# Patient Record
Sex: Male | Born: 1960 | Race: Black or African American | Hispanic: No | Marital: Single | Smoking: Current every day smoker
Health system: Southern US, Community
[De-identification: ages and names within clinical notes are randomized; demographics above are authoritative.]

## PROBLEM LIST (undated history)

## (undated) DIAGNOSIS — Z720 Tobacco use: Secondary | ICD-10-CM

## (undated) HISTORY — DX: Tobacco use: Z72.0

## (undated) HISTORY — PX: HERNIA REPAIR: SHX51

## (undated) HISTORY — PX: COLONOSCOPY: SHX174

---

## 1998-06-25 ENCOUNTER — Ambulatory Visit (HOSPITAL_BASED_OUTPATIENT_CLINIC_OR_DEPARTMENT_OTHER): Admission: RE | Admit: 1998-06-25 | Discharge: 1998-06-25 | Payer: Self-pay | Admitting: Otolaryngology

## 2000-05-28 ENCOUNTER — Emergency Department (HOSPITAL_COMMUNITY): Admission: EM | Admit: 2000-05-28 | Discharge: 2000-05-28 | Payer: Self-pay | Admitting: *Deleted

## 2000-05-28 ENCOUNTER — Encounter: Payer: Self-pay | Admitting: Emergency Medicine

## 2000-11-01 ENCOUNTER — Ambulatory Visit (HOSPITAL_BASED_OUTPATIENT_CLINIC_OR_DEPARTMENT_OTHER): Admission: RE | Admit: 2000-11-01 | Discharge: 2000-11-01 | Payer: Self-pay | Admitting: Surgery

## 2004-07-07 ENCOUNTER — Emergency Department (HOSPITAL_COMMUNITY): Admission: EM | Admit: 2004-07-07 | Discharge: 2004-07-07 | Payer: Self-pay | Admitting: Family Medicine

## 2004-10-19 ENCOUNTER — Emergency Department (HOSPITAL_COMMUNITY): Admission: EM | Admit: 2004-10-19 | Discharge: 2004-10-19 | Payer: Self-pay | Admitting: Emergency Medicine

## 2005-01-12 ENCOUNTER — Emergency Department (HOSPITAL_COMMUNITY): Admission: EM | Admit: 2005-01-12 | Discharge: 2005-01-12 | Payer: Self-pay | Admitting: Family Medicine

## 2005-08-02 ENCOUNTER — Emergency Department (HOSPITAL_COMMUNITY): Admission: EM | Admit: 2005-08-02 | Discharge: 2005-08-02 | Payer: Self-pay | Admitting: Family Medicine

## 2005-09-11 ENCOUNTER — Emergency Department (HOSPITAL_COMMUNITY): Admission: EM | Admit: 2005-09-11 | Discharge: 2005-09-11 | Payer: Self-pay | Admitting: Emergency Medicine

## 2007-03-22 ENCOUNTER — Emergency Department (HOSPITAL_COMMUNITY): Admission: EM | Admit: 2007-03-22 | Discharge: 2007-03-22 | Payer: Self-pay | Admitting: Family Medicine

## 2007-03-28 ENCOUNTER — Encounter: Admission: RE | Admit: 2007-03-28 | Discharge: 2007-03-28 | Payer: Self-pay | Admitting: Emergency Medicine

## 2008-06-10 ENCOUNTER — Emergency Department (HOSPITAL_COMMUNITY): Admission: AD | Admit: 2008-06-10 | Discharge: 2008-06-10 | Payer: Self-pay | Admitting: Family Medicine

## 2008-08-02 ENCOUNTER — Emergency Department (HOSPITAL_COMMUNITY): Admission: EM | Admit: 2008-08-02 | Discharge: 2008-08-03 | Payer: Self-pay | Admitting: Emergency Medicine

## 2008-12-10 ENCOUNTER — Encounter: Admission: RE | Admit: 2008-12-10 | Discharge: 2008-12-10 | Payer: Self-pay | Admitting: Family Medicine

## 2010-01-24 ENCOUNTER — Emergency Department (HOSPITAL_COMMUNITY): Admission: EM | Admit: 2010-01-24 | Discharge: 2010-01-24 | Payer: Self-pay | Admitting: Emergency Medicine

## 2010-07-16 LAB — POCT CARDIAC MARKERS
CKMB, poc: 1.1 ng/mL (ref 1.0–8.0)
Myoglobin, poc: 40.4 ng/mL (ref 12–200)
Myoglobin, poc: 66.9 ng/mL (ref 12–200)
Troponin i, poc: <0.05 ng/mL (ref 0.00–0.09)

## 2010-07-16 LAB — CBC
Hemoglobin: 15.7 g/dL (ref 13.0–17.0)
MCH: 31.2 pg (ref 26.0–34.0)
MCHC: 34.6 g/dL (ref 30.0–36.0)
MCV: 90.3 fL (ref 78.0–100.0)
RBC: 5.03 MIL/uL (ref 4.22–5.81)

## 2010-07-16 LAB — POCT I-STAT, CHEM 8
BUN: 16 mg/dL (ref 6–23)
Chloride: 105 mEq/L (ref 96–112)
Creatinine, Ser: 1.3 mg/dL (ref 0.4–1.5)
Hemoglobin: 17.3 g/dL — ABNORMAL HIGH (ref 13.0–17.0)
Potassium: 4.3 mEq/L (ref 3.5–5.1)
Sodium: 139 mEq/L (ref 135–145)

## 2010-07-16 LAB — DIFFERENTIAL
Basophils Relative: 1 % (ref 0–1)
Eosinophils Absolute: 0.3 10*3/uL (ref 0.0–0.7)
Eosinophils Relative: 5 % (ref 0–5)
Lymphs Abs: 2 10*3/uL (ref 0.7–4.0)
Monocytes Absolute: 0.4 10*3/uL (ref 0.1–1.0)
Monocytes Relative: 7 % (ref 3–12)

## 2010-08-18 LAB — CBC
Hemoglobin: 14.5 g/dL (ref 13.0–17.0)
MCHC: 33.9 g/dL (ref 30.0–36.0)
RDW: 14.1 % (ref 11.5–15.5)

## 2010-08-18 LAB — DIFFERENTIAL
Basophils Absolute: 0 10*3/uL (ref 0.0–0.1)
Basophils Relative: 1 % (ref 0–1)
Eosinophils Relative: 3 % (ref 0–5)
Monocytes Absolute: 0.3 10*3/uL (ref 0.1–1.0)
Neutro Abs: 3.3 10*3/uL (ref 1.7–7.7)

## 2010-08-18 LAB — BASIC METABOLIC PANEL
BUN: 10 mg/dL (ref 6–23)
CO2: 29 mEq/L (ref 19–32)
Calcium: 9.5 mg/dL (ref 8.4–10.5)
Glucose, Bld: 83 mg/dL (ref 70–99)
Sodium: 138 mEq/L (ref 135–145)

## 2010-08-18 LAB — POCT CARDIAC MARKERS
CKMB, poc: <1 ng/mL — ABNORMAL LOW (ref 1.0–8.0)
Myoglobin, poc: 41.8 ng/mL (ref 12–200)

## 2010-09-18 NOTE — Op Note (Signed)
[place]  Patient:    JEMELL, TOWN                       MRN: [id number] Proc. Date: 11/01/00 Adm. Date:  [id number] Attending:  Katha Cabal CC:         Jethro Bastos, M.D.   Operative Report  CCS# [id number]  PREOPERATIVE DIAGNOSIS:  Bilateral inguinal hernias.  POSTOPERATIVE DIAGNOSIS:  Bilateral direct hernias, right greater than left.  OPERATION PERFORMED:  Laparoscopic bilateral herniorrhaphy.  SURGEON:  Thornton Park. Daphine Deutscher, M.D.  ANESTHESIA:  General endotracheal.  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  Informed consent was obtained regarding laparoscopic as well as open repair with complications not limited to recurrence, numbness, infection and bleeding were explained to him both in the office and preoperatively.  He was taken to room 1 at [facility] and after general anesthesia was administered.  The abdomen was prepped with Betadine and draped sterilely and a Foley catheter was inserted.  He had also received a gram of Ancef.  A longitudinal incision was made just below the umbilicus and went off to the right of midline and incised the anterior rectus sheath.  A pursestring suture was placed around this.  I dissected the rectus muscle laterally and inserted a balloon down to the pubis.  With the scope in place,  This was insufflated to create a nice preperitoneal dissection.  The balloon was then withdrawn and a port with a balloon for anchoring was inserted and the preperitoneal space was inflated.  Using the Marcaine needle, I went ahead and injected the subsequent port sites and used this to guide into the preperitoneal space.  Two 5 mm ports were placed. Dissection was started on the right side and the fairly large right direct hernia was easily visualized.   There was a nice clear demarcation of the peritoneum well down on the cord structures indicating that there was no evidence of an indirect hernia.  Nevertheless I did skeletonize the cord, identified the cord structures and separated these on the right side. Attention was directed to the left side where again, a left direct hernia was visualized after removing some of the fatty tissue that was incarcerated up within it.  These cord structures, too, were dissected free and then elected to go ahead and begin the repair first on the right side.  A 4 x 6 piece of mesh was cut and brought into the preperitoneal space and tacked to the pubis medially and anteriorly along the linea.  It was then rolled out laterally where it was tacked anteriorly and wherever I could feel it laterally but not going below the inguinal ligament.  This was tacked medially along Coopers ligament down to the femoral vein.  This provided good coverage of the direct defect.  A similar size piece of mesh was cut and placed on the left side, then overlapping the one on the right, again the hernia tacker was used to place these titanium screws ([employer]) along Coopers ligament and then anteriorly and laterally again where I could always feel it.  The preperitoneal space was then inspected and no bleeding was seen.  The area was deflated and the 5 mm ports were withdrawn and the umbilical port was tied down.  The skin was  closed with 4-0 Vicryl with benzoin and Steri-Strips.  The patient seemed to tolerate the procedure well.  If he tolerates this well, he will be discharged from the recovery room or if not, he may be kept overnight in the [facility]. DD:  11/01/00 TD:  11/01/00 Job: [id number]
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
## 2012-12-27 ENCOUNTER — Encounter (HOSPITAL_COMMUNITY): Payer: Self-pay | Admitting: Emergency Medicine

## 2012-12-27 ENCOUNTER — Emergency Department (HOSPITAL_COMMUNITY)
Admission: EM | Admit: 2012-12-27 | Discharge: 2012-12-27 | Disposition: A | Discharge status: Home / Self Care | Payer: BC Managed Care – PPO | Source: Home / Self Care | Attending: Emergency Medicine | Admitting: Emergency Medicine

## 2012-12-27 DIAGNOSIS — S39012A Strain of muscle, fascia and tendon of lower back, initial encounter: Secondary | ICD-10-CM

## 2012-12-27 DIAGNOSIS — S335XXA Sprain of ligaments of lumbar spine, initial encounter: Secondary | ICD-10-CM

## 2012-12-27 MED ORDER — CYCLOBENZAPRINE HCL 5 MG PO TABS: 5.0000 mg | ORAL_TABLET | Freq: Three times a day (TID) | ORAL | Status: DC | PRN

## 2012-12-27 MED ORDER — HYDROCODONE-ACETAMINOPHEN 5-325 MG PO TABS: 1.0000 | ORAL_TABLET | Freq: Three times a day (TID) | ORAL | Status: DC | PRN

## 2012-12-27 MED ORDER — DICLOFENAC SODIUM 50 MG PO TBEC: 50.0000 mg | DELAYED_RELEASE_TABLET | Freq: Two times a day (BID) | ORAL | Status: DC

## 2012-12-27 NOTE — ED Provider Notes (Signed)
Medical screening examination/treatment/procedure(s) were performed by resident-physician practitioner and as supervising physician I was immediately available for consultation/collaboration.  Davonte Siebenaler   Miller Edgington, MD 12/27/12 2012 

## 2012-12-27 NOTE — ED Notes (Addendum)
Pt c/o lower back pain x 5 days. Pt states he thinks he hurt his back from picking up heavy luggage and carrying it up stairs. Pt has not taken any meds.... Jan Ranson, SMA

## 2012-12-27 NOTE — ED Provider Notes (Signed)
CSN: [id number]     Arrival date & time 12/27/12  1820 History   First MD Initiated Contact with Patient 12/27/12 1923     Chief Complaint  Patient presents with  . Back Pain   (Consider location/radiation/quality/duration/timing/severity/associated sxs/prior Treatment) HPI Pt is 52 yo M with lower back pain. Pain started 5 days ago, unchanged since then. He states he was carrying heavy luggage up the stairs the night before. No pops, pain or ache at time of carrying luggage. Hurts most with changing positions or lying flat. Tried taking a muscle relaxer which did not help. Nothing makes the pain better. Never had back problems before. Current pain is 8/10. No numbness or tingling in legs. No loss of bowel or bladder. He works as a Education administrator.   History reviewed. No pertinent past medical history. History reviewed. No pertinent past surgical history. No family history on file. History  Substance Use Topics  . Smoking status: Never Smoker   . Smokeless tobacco: Not on file  . Alcohol Use: Not on file   Review of Systems  Constitutional: Negative for fever and chills.  HENT: Negative for congestion.   Eyes: Negative for visual disturbance.  Respiratory: Negative for cough and shortness of breath.   Cardiovascular: Negative for chest pain and leg swelling.  Gastrointestinal: Negative for abdominal pain.  Genitourinary: Negative for dysuria.  Musculoskeletal: Positive for back pain. Negative for myalgias, arthralgias and gait problem.  Skin: Negative for rash.  Neurological: Negative for headaches.    Allergies  Review of patient's allergies indicates no known allergies.  Home Medications   Current Outpatient Rx  Name  Route  Sig  Dispense  Refill  . cyclobenzaprine (FLEXERIL) 5 MG tablet   Oral   Take 1 tablet (5 mg total) by mouth 3 (three) times daily as needed for muscle spasms.   20 tablet   0   . diclofenac (VOLTAREN) 50 MG EC tablet   Oral   Take 1 tablet (50 mg total)  by mouth 2 (two) times daily.   20 tablet   0   . HYDROcodone-acetaminophen (NORCO/VICODIN) 5-325 MG per tablet   Oral   Take 1 tablet by mouth every 8 (eight) hours as needed for pain.   20 tablet   0    BP 134/88  Temp(Src) 98.2 F (36.8 C) (Oral)  Resp 14  SpO2 95% Physical Exam  Constitutional: He is oriented to person, place, and time. He appears well-developed and well-nourished. No distress.  HENT:  Head: Normocephalic and atraumatic.  Mouth/Throat: Oropharynx is clear and moist.  Eyes: Pupils are equal, round, and reactive to light.  Neck: Normal range of motion. Neck supple.  Cardiovascular: Normal rate, regular rhythm and normal heart sounds.   Pulmonary/Chest: Effort normal and breath sounds normal. He has no wheezes.  Abdominal: Soft. There is no tenderness.  Musculoskeletal: Normal range of motion. He exhibits no edema.       Lumbar back: He exhibits tenderness (Paraspinal muscle tenderness R>L). He exhibits normal range of motion, no bony tenderness, no deformity and no spasm.  Negative straight leg raise  Lymphadenopathy:    He has no cervical adenopathy.  Neurological: He is alert and oriented to person, place, and time. He displays normal reflexes. No cranial nerve deficit. Coordination normal.  Skin: Skin is warm and dry. No rash noted.    ED Course  Procedures (including critical care time) Labs Review Labs Reviewed - No data to display Imaging Review No  results found.  MDM   1. Low back strain, initial encounter    52 yo previously healthy male with low back strain after carrying heavy luggage - No acute indication for imaging - Flexeril prn spasm - Diclofenac for anti-inflammatory - Vicodin prn for severe pain - Encouraged to rest and use ice on back - Out of work for next 2 days with no heavy lifting for 5 days. - Establish new PCP but f/u here at [facility] if pain worsens or fails to improve  Hilarie Fredrickson, MD 12/27/12 1953

## 2013-08-08 ENCOUNTER — Ambulatory Visit
Admission: RE | Admit: 2013-08-08 | Discharge: 2013-08-08 | Disposition: A | Discharge status: Home / Self Care | Payer: BC Managed Care – PPO | Source: Ambulatory Visit | Attending: Medical | Admitting: Medical

## 2013-08-08 ENCOUNTER — Encounter: Payer: Self-pay | Admitting: Medical

## 2013-08-08 ENCOUNTER — Ambulatory Visit (INDEPENDENT_AMBULATORY_CARE_PROVIDER_SITE_OTHER): Payer: BC Managed Care – PPO | Admitting: Medical

## 2013-08-08 VITALS — BP 130/80 | HR 82 | Temp 98.2°F | Resp 14 | Ht 66.2 in | Wt 137.0 lb

## 2013-08-08 DIAGNOSIS — R5381 Other malaise: Secondary | ICD-10-CM

## 2013-08-08 DIAGNOSIS — R630 Anorexia: Secondary | ICD-10-CM

## 2013-08-08 DIAGNOSIS — F17200 Nicotine dependence, unspecified, uncomplicated: Secondary | ICD-10-CM

## 2013-08-08 DIAGNOSIS — R5383 Other fatigue: Secondary | ICD-10-CM

## 2013-08-08 DIAGNOSIS — R634 Abnormal weight loss: Secondary | ICD-10-CM

## 2013-08-08 LAB — CBC WITH DIFFERENTIAL/PLATELET
BASOS ABS: 0 10*3/uL (ref 0.0–0.1)
Basophils Relative: 0 % (ref 0–1)
EOS PCT: 1 % (ref 0–5)
Eosinophils Absolute: 0.1 10*3/uL (ref 0.0–0.7)
HEMATOCRIT: 41.5 % (ref 39.0–52.0)
HEMOGLOBIN: 14.1 g/dL (ref 13.0–17.0)
LYMPHS ABS: 2.3 10*3/uL (ref 0.7–4.0)
LYMPHS PCT: 40 % (ref 12–46)
MCH: 29.9 pg (ref 26.0–34.0)
MCHC: 34 g/dL (ref 30.0–36.0)
MCV: 88.1 fL (ref 78.0–100.0)
MONO ABS: 0.5 10*3/uL (ref 0.1–1.0)
Monocytes Relative: 9 % (ref 3–12)
NEUTROS ABS: 2.9 10*3/uL (ref 1.7–7.7)
Neutrophils Relative %: 50 % (ref 43–77)
Platelets: 351 10*3/uL (ref 150–400)
RBC: 4.71 MIL/uL (ref 4.22–5.81)
RDW: 13.6 % (ref 11.5–15.5)
WBC: 5.8 10*3/uL (ref 4.0–10.5)

## 2013-08-08 LAB — POCT URINALYSIS DIPSTICK
BILIRUBIN UA: NEGATIVE
Blood, UA: NEGATIVE
Glucose, UA: NEGATIVE
KETONES UA: NEGATIVE
NITRITE UA: NEGATIVE
PH UA: 6
Spec Grav, UA: 1.015
Urobilinogen, UA: NEGATIVE

## 2013-08-08 LAB — COMPREHENSIVE METABOLIC PANEL
ALT: 15 U/L (ref 0–53)
AST: 17 U/L (ref 0–37)
Albumin: 4 g/dL (ref 3.5–5.2)
Alkaline Phosphatase: 71 U/L (ref 39–117)
BILIRUBIN TOTAL: 0.5 mg/dL (ref 0.2–1.2)
BUN: 15 mg/dL (ref 6–23)
CALCIUM: 9.7 mg/dL (ref 8.4–10.5)
CHLORIDE: 103 meq/L (ref 96–112)
CO2: 26 meq/L (ref 19–32)
CREATININE: 1.07 mg/dL (ref 0.50–1.35)
GLUCOSE: 76 mg/dL (ref 70–99)
Potassium: 4.3 mEq/L (ref 3.5–5.3)
Sodium: 138 mEq/L (ref 135–145)
Total Protein: 6.4 g/dL (ref 6.0–8.3)

## 2013-08-08 LAB — SEDIMENTATION RATE: SED RATE: 1 mm/h (ref 0–16)

## 2013-08-08 LAB — TSH: TSH: 1.38 u[IU]/mL (ref 0.350–4.500)

## 2013-08-08 NOTE — Progress Notes (Signed)
Subjective:   Douglas Mcgrath is a 53 y.o. male presenting on 08/08/2013 with COLD SX. WEIGHT LOSS, NO APPETITE  New patient today.  No routine primary care, but has seen Urgent Care for physical in last few years.  Been losing weight since last year.  His normal weight is 147lb.  Doesn't have a good appetite for some time now, possible months, but this is intermittent.   Typically eats 1-2 meals daily.   Works long days, paints trucks for a living, physically active, up and down ladders all day, 30 min for lunch.    Been thorugh some stress, last year split up with girlfriend, had some financial problems.  Was unemployred for a while back in 2008, and this led to credit problems that has continued to be an issue.  He worries that the weight loss is related to something worse. He is a long-term smoker, smoking 1.5 packs daily, drinks alcohol on a somewhat regular basis. No prior colonoscopy, its been a few years since last prostate check.  He would like to be checked for Trichomonas.  Prior girlfriend had this.  No current sexual partner.  No other complaint.  Review of Systems  Review of Systems Constitutional: -fever, -chills, -sweats, +unexpected weight change,+fatigue ENT: +runny nose, -ear pain, -sore throat Cardiology:  -chest pain, -palpitations, -edema Respiratory: +recent cough x 4-5 days, -shortness of breath, -wheezing Gastroenterology: -abdominal pain, +nausea, -vomiting, +loose stool, has urge to defecate after eating, -constipation  Hematology: -bleeding or bruising problems Musculoskeletal: -arthralgias, -myalgias, -joint swelling, -back pain Ophthalmology: -vision changes Urology: -dysuria, -difficulty urinating, -hematuria, -urinary frequency, -urgency Neurology: -headache, -weakness, -tingling, -numbness       Objective:    BP 130/80  Pulse 82  Temp(Src) 98.2 F (36.8 C) (Oral)  Resp 14  Ht 5' 6.2" (1.681 m)  Wt 137 lb (62.143 kg)  BMI 21.99  kg/m2  General appearance: alert, no distress, WD/WN, lean AA male HEENT: normocephalic, sclerae anicteric, TMs pearly, nares patent, no discharge or erythema, pharynx normal Oral cavity: MMM, upper dentures, moderate plaque, no lesions Neck: supple, no lymphadenopathy, no thyromegaly, no masses Heart: RRR, normal S1, S2, no murmurs Lungs: CTA bilaterally, no wheezes, rhonchi, or rales Abdomen: +bs, soft, non tender, non distended, no masses, no hepatomegaly, no splenomegaly Pulses: 2+ symmetric, upper and lower extremities, normal cap refill Ext: no edema      Assessment: Encounter Diagnoses  Name Primary?  . Loss of weight Yes  . Other malaise and fatigue   . Appetite loss   . Tobacco use disorder      Plan: We discussed his symptoms and concerns, discussed long differential for weight loss, discussed potential other evaluation. We will start with labs, urine, chest x-ray.  Douglas Mcgrath was seen today for cold sx. weight loss, no appetite.  Diagnoses and associated orders for this visit:  Loss of weight - Comprehensive metabolic panel - CBC with Differential - TSH - PSA - DG Chest 2 View; Future - POCT urinalysis dipstick - POCT Wet Prep ([place] [place]) - Sedimentation rate  Other malaise and fatigue - Comprehensive metabolic panel - CBC with Differential - TSH - PSA - DG Chest 2 View; Future - POCT urinalysis dipstick - POCT Wet Prep ([place] [place]) - Sedimentation rate  Appetite loss - Comprehensive metabolic panel - CBC with Differential - TSH - PSA - DG Chest 2 View; Future - POCT urinalysis dipstick - POCT Wet Prep [facility] [place]) - Sedimentation rate  Tobacco use disorder -  Comprehensive metabolic panel - CBC with Differential - TSH - PSA - DG Chest 2 View; Future - POCT urinalysis dipstick - POCT Wet Prep [facility]) - Sedimentation rate     Return pending labs.

## 2013-08-09 LAB — PSA: PSA: 0.68 ng/mL (ref <=4.00)

## 2018-01-24 ENCOUNTER — Encounter (HOSPITAL_BASED_OUTPATIENT_CLINIC_OR_DEPARTMENT_OTHER): Payer: Self-pay

## 2018-01-24 ENCOUNTER — Ambulatory Visit (HOSPITAL_COMMUNITY)
Admission: EM | Admit: 2018-01-24 | Discharge: 2018-01-24 | Disposition: A | Discharge status: Home / Self Care | Payer: Managed Care, Other (non HMO) | Source: Home / Self Care

## 2018-01-24 ENCOUNTER — Emergency Department (HOSPITAL_BASED_OUTPATIENT_CLINIC_OR_DEPARTMENT_OTHER)
Admission: EM | Admit: 2018-01-24 | Discharge: 2018-01-24 | Disposition: A | Discharge status: Home / Self Care | Payer: Managed Care, Other (non HMO) | Attending: Emergency Medicine | Admitting: Emergency Medicine

## 2018-01-24 ENCOUNTER — Other Ambulatory Visit: Payer: Self-pay

## 2018-01-24 DIAGNOSIS — Z5321 Procedure and treatment not carried out due to patient leaving prior to being seen by health care provider: Secondary | ICD-10-CM | POA: Insufficient documentation

## 2018-01-24 DIAGNOSIS — R197 Diarrhea, unspecified: Secondary | ICD-10-CM | POA: Insufficient documentation

## 2018-01-24 NOTE — ED Triage Notes (Addendum)
Pt c/o diarrhea and loose stools after he eats for the last few days, denies abdominal pain, states he had 3 episodes of diarrhea today, has not tried anything for his symptoms, no sick contacts, no recent abx

## 2018-01-24 NOTE — ED Notes (Signed)
Pt. Said he did not want to stay and was going home.  Pt in no distress.

## 2018-01-24 NOTE — ED Notes (Signed)
Pt. Reports No appetite since Sunday.    Pt. Reports the diarrhea is just water,  Not seedy and and not slimy.

## 2019-07-28 ENCOUNTER — Other Ambulatory Visit: Payer: Self-pay

## 2019-07-28 ENCOUNTER — Emergency Department (HOSPITAL_COMMUNITY)
Admission: EM | Admit: 2019-07-28 | Discharge: 2019-07-28 | Disposition: A | Discharge status: Home / Self Care | Payer: Managed Care, Other (non HMO) | Attending: Emergency Medicine | Admitting: Emergency Medicine

## 2019-07-28 ENCOUNTER — Encounter (HOSPITAL_COMMUNITY): Payer: Self-pay | Admitting: Student

## 2019-07-28 DIAGNOSIS — Z5321 Procedure and treatment not carried out due to patient leaving prior to being seen by health care provider: Secondary | ICD-10-CM | POA: Insufficient documentation

## 2019-07-28 DIAGNOSIS — M542 Cervicalgia: Secondary | ICD-10-CM

## 2019-07-28 DIAGNOSIS — F17210 Nicotine dependence, cigarettes, uncomplicated: Secondary | ICD-10-CM | POA: Diagnosis not present

## 2019-07-28 MED ORDER — LIDOCAINE 5 % EX PTCH: 2.0000 | MEDICATED_PATCH | CUTANEOUS | 0 refills | Status: AC

## 2019-07-28 MED ORDER — NAPROXEN 500 MG PO TABS: 500.0000 mg | ORAL_TABLET | Freq: Two times a day (BID) | ORAL | 0 refills | Status: AC

## 2019-07-28 MED ORDER — KETOROLAC TROMETHAMINE 60 MG/2ML IM SOLN
15.0000 mg | Freq: Once | INTRAMUSCULAR | Status: AC
  Administered 2019-07-28: 15 mg via INTRAMUSCULAR
  Filled 2019-07-28: qty 2

## 2019-07-28 MED ORDER — METHOCARBAMOL 500 MG PO TABS: 500.0000 mg | ORAL_TABLET | Freq: Three times a day (TID) | ORAL | 0 refills | Status: AC | PRN

## 2019-07-28 MED ORDER — LIDOCAINE 5 % EX PTCH: 2.0000 | MEDICATED_PATCH | CUTANEOUS | 0 refills | Status: DC

## 2019-07-28 NOTE — Discharge Instructions (Addendum)
You were seen in the emergency department today for neck pain.  We suspect your pain is related to a muscle strain/spasm.  We are sending you home with the following medicines:  - Naproxen is a nonsteroidal anti-inflammatory medication that will help with pain and swelling. Be sure to take this medication as prescribed with food, 1 pill every 12 hours,  It should be taken with food, as it can cause stomach upset, and more seriously, stomach bleeding. Do not take other nonsteroidal anti-inflammatory medications with this such as Advil, Motrin, Aleve, Mobic, Goodie Powder, or Motrin.  Do not start taking this medication until this evening as we gave you a similar medicine in the emergency department today.  - Robaxin is the muscle relaxer I have prescribed, this is meant to help with muscle tightness. Be aware that this medication may make you drowsy therefore the first time you take this it should be at a time you are in an environment where you can rest. Do not drive or operate heavy machinery when taking this medication. Do not drink alcohol or take other sedating medications with this medicine such as narcotics or benzodiazepines.   -Lidoderm patch: Please apply 1 patch to each side of your neck for a total of 2 patches per day.  Remove and discard within 12 hours.  These are patches to help numb/to the area.  You make take Tylenol per over the counter dosing with these medications.   We have prescribed you new medication(s) today. Discuss the medications prescribed today with your pharmacist as they can have adverse effects and interactions with your other medicines including over the counter and prescribed medications. Seek medical evaluation if you start to experience new or abnormal symptoms after taking one of these medicines, seek care immediately if you start to experience difficulty breathing, feeling of your throat closing, facial swelling, or rash as these could be indications of a more serious  allergic reaction  Please apply heat to the affected area to help with discomfort.  Please follow-up with your primary care provider within 3 days for reevaluation.  Return to the emergency department for new or worsening symptoms including but not limited to increased pain, numbness, weakness, tingling, fever, loss of control of bowel or bladder function, overlying rash, or any other concerns.

## 2019-07-28 NOTE — ED Provider Notes (Signed)
[facility] DEPT Provider Note   CSN: [id number] Arrival date & time: 07/28/19  6384     History Chief Complaint  Patient presents with  . Neck Pain    Douglas Mcgrath is a 59 y.o. male with a history of tobacco abuse who presents to the ED with complaints of neck pain since yesterday AM. Patient states when he got out of the shower he thinks he turned the wrong way which resulted in neck discomfort. States pain is bilateral (R>L), constant, worse with turning of the head, no alleviating factors. Tried topical muscle cream without much change. No direct trauma to the area. No falls/accidents. Denies numbness, tingling, weakness, saddle anesthesia, incontinence to bowel/bladder, fever, chills, IV drug use, dysuria, or hx of cancer. Patient has not had prior neck surgeries.    HPI     Past Medical History:  Diagnosis Date  . Tobacco use     There are no problems to display for this patient.   Past Surgical History:  Procedure Laterality Date  . COLONOSCOPY     never  . HERNIA REPAIR     right inguinal       Family History  Problem Relation Age of Onset  . Diabetes Mother   . Stroke Father   . Cancer Neg Hx     Social History   Tobacco Use  . Smoking status: Current Every Day Smoker    Packs/day: 1.50  Substance Use Topics  . Alcohol use: Yes    Alcohol/week: 2.0 standard drinks    Types: 1 Cans of beer, 1 Shots of liquor per week  . Drug use: No    Home Medications Prior to Admission medications   Not on File    Allergies    Patient has no known allergies.  Review of Systems   Review of Systems  Constitutional: Negative for chills, fever and unexpected weight change.  Eyes: Negative for visual disturbance.  Respiratory: Negative for shortness of breath.   Cardiovascular: Negative for chest pain.  Gastrointestinal: Negative for abdominal pain, nausea and vomiting.  Genitourinary: Negative for dysuria.    Musculoskeletal: Positive for neck pain.  Neurological: Negative for syncope, speech difficulty, weakness, numbness and headaches.       Negative for saddle anesthesia or bowel/bladder incontinence.     Physical Exam Updated Vital Signs BP (!) 136/95 (BP Location: Right Arm)   Pulse 63   Temp 98.4 F (36.9 C)   Resp 15   Ht 5\' 6"  (1.676 m)   Wt 64.4 kg   SpO2 100%   BMI 22.92 kg/m   Physical Exam Vitals and nursing note reviewed.  Constitutional:      General: He is not in acute distress.    Appearance: Normal appearance. He is not ill-appearing or toxic-appearing.  HENT:     Head: Normocephalic and atraumatic.  Eyes:     Extraocular Movements: Extraocular movements intact.     Pupils: Pupils are equal, round, and reactive to light.  Neck:     Comments: No palpable step off.  No overlying rashes.  Some decreased range of motion noted. No nuchal rigidity.  Cardiovascular:     Rate and Rhythm: Normal rate.     Pulses:          Radial pulses are 2+ on the right side and 2+ on the left side.  Pulmonary:     Effort: Pulmonary effort is normal. No respiratory distress.     Breath  sounds: Normal breath sounds.  Abdominal:     General: There is no distension.  Musculoskeletal:     Cervical back: Neck supple. No edema, erythema, rigidity or crepitus. Muscular tenderness (bilateral, R > L) present. No spinous process tenderness.     Comments: Upper extremities:  Patient has intact AROM throughout. No point/focal bony tenderness.   Skin:    General: Skin is warm and dry.     Capillary Refill: Capillary refill takes less than 2 seconds.  Neurological:     Mental Status: He is alert.     Comments: Alert. Clear speech. Sensation grossly intact to bilateral upper/lower extremities. 5/5 symmetric strength with grip strength, elbow flexion/extension, shoulder flexion/extension, and ankle plantar/dorsiflexion. Ambulatory.   Psychiatric:        Mood and Affect: Mood normal.         Behavior: Behavior normal.     ED Results / Procedures / Treatments   Labs (all labs ordered are listed, but only abnormal results are displayed) Labs Reviewed - No data to display  EKG None  Radiology No results found.  Procedures Procedures (including critical care time)  Medications Ordered in ED Medications  ketorolac (TORADOL) injection 15 mg (has no administration in time range)    ED Course  I have reviewed the triage vital signs and the nursing notes.  Pertinent labs & imaging results that were available during my care of the patient were reviewed by me and considered in my medical decision making (see chart for details).    MDM Rules/Calculators/A&P                      Patient presents to the emergency department with complaints of neck pain after turning his head yesterday morning.  No direct trauma.  He is nontoxic, resting comfortably, vitals WNL with the exception of elevated diastolic BP, doubt HTN emergency.  Afebrile, no history of IVDU, doubt epidural abscess.  No direct traumatic impact, no midline tenderness, no focal neurologic deficits, do not suspect spinal cord compression or vertebral fracture.  No overlying rashes to indicate shingles.  Overall suspect muscular in nature.  Will treat with Toradol in the ER and discharged home with anti-inflammatories, muscle relaxant, and Lidoderm patches. We discussed no driving or operating heavy machinery when taking muscle relaxant. Last creatinine WNL in terms of NSAIDs, however was from several years ago. I discussed  treatment plan, need for follow-up, and return precautions with the patient. Provided opportunity for questions, patient confirmed understanding and is in agreement with plan.   Final Clinical Impression(s) / ED Diagnoses Final diagnoses:  Neck pain    Rx / DC Orders ED Discharge Orders         Ordered    naproxen (NAPROSYN) 500 MG tablet  2 times daily     07/28/19 0700    methocarbamol  (ROBAXIN) 500 MG tablet  Every 8 hours PRN     07/28/19 0700    lidocaine (LIDODERM) 5 %  Every 24 hours,   Status:  Discontinued     07/28/19 0700    lidocaine (LIDODERM) 5 %  Every 24 hours     07/28/19 0701           Eleasha Cataldo, Pleas Koch, PA-C 07/28/19 0703    Zadie Rhine, MD 07/28/19 [phone]

## 2019-07-28 NOTE — ED Triage Notes (Signed)
Per pt he was getting out of shower last night and was drying off and when he turned he felt like something pulled. He has been very sore and hard to turn his head.

## 2019-07-28 NOTE — ED Notes (Signed)
Pt attempted to sign, sig pad not working

## 2019-07-28 NOTE — ED Triage Notes (Signed)
Pt to ED with c/o of neck pain. Pt states he was getting out of shower and "somehow messed up his neck" pt rates pain 9/10 and states "cant turn it". Pt denies falling any other type of injury.

## 2020-10-14 ENCOUNTER — Encounter (HOSPITAL_BASED_OUTPATIENT_CLINIC_OR_DEPARTMENT_OTHER): Payer: Self-pay | Admitting: Obstetrics and Gynecology

## 2020-10-14 ENCOUNTER — Other Ambulatory Visit: Payer: Self-pay

## 2020-10-14 ENCOUNTER — Emergency Department (HOSPITAL_BASED_OUTPATIENT_CLINIC_OR_DEPARTMENT_OTHER)
Admission: EM | Admit: 2020-10-14 | Discharge: 2020-10-14 | Disposition: A | Discharge status: Home / Self Care | Payer: Managed Care, Other (non HMO) | Attending: Emergency Medicine | Admitting: Emergency Medicine

## 2020-10-14 DIAGNOSIS — M25551 Pain in right hip: Secondary | ICD-10-CM | POA: Diagnosis present

## 2020-10-14 DIAGNOSIS — M5441 Lumbago with sciatica, right side: Secondary | ICD-10-CM | POA: Insufficient documentation

## 2020-10-14 DIAGNOSIS — F17210 Nicotine dependence, cigarettes, uncomplicated: Secondary | ICD-10-CM | POA: Insufficient documentation

## 2020-10-14 DIAGNOSIS — M5431 Sciatica, right side: Secondary | ICD-10-CM

## 2020-10-14 MED ORDER — CYCLOBENZAPRINE HCL 10 MG PO TABS: 10.0000 mg | ORAL_TABLET | Freq: Two times a day (BID) | ORAL | 0 refills | Status: AC | PRN

## 2020-10-14 MED ORDER — PREDNISONE 10 MG PO TABS: 20.0000 mg | ORAL_TABLET | Freq: Two times a day (BID) | ORAL | 0 refills | Status: AC

## 2020-10-14 NOTE — ED Provider Notes (Signed)
[facility] EMERGENCY DEPT Provider Note   CSN: [id number] Arrival date & time: 10/14/20  1023     History Chief Complaint  Patient presents with   Hip Pain    Douglas Mcgrath is a 60 y.o. male.  HPI Pleasant 60 year old man who presents today complaining of right buttock to right hip pain after some pulling while putting a boat trailer on.  However, he has had no direct trauma and no fall.  He was seen at an urgent care and reports he had x-rays obtained that did not show any acute abnormality.  They gave him hydrocodone which only made him nauseated.  He has not taken other medications.  He denies any numbness, tingling, weakness, loss of bowel or bladder control, decreased perineal sensation, or foot weakness.  He has had some similar problems in the past diagnosed as sciatica.  He is using a cane to walk due to pain.     Past Medical History:  Diagnosis Date   Tobacco use     There are no problems to display for this patient.   Past Surgical History:  Procedure Laterality Date   COLONOSCOPY     never   HERNIA REPAIR     right inguinal       Family History  Problem Relation Age of Onset   Diabetes Mother    Stroke Father    Cancer Neg Hx     Social History   Tobacco Use   Smoking status: Every Day    Packs/day: 1.50    Pack years: 0.00    Types: Cigarettes  Substance Use Topics   Alcohol use: Yes    Alcohol/week: 2.0 standard drinks    Types: 1 Cans of beer, 1 Shots of liquor per week   Drug use: No    Home Medications Prior to Admission medications   Medication Sig Start Date End Date Taking? Authorizing Provider  lidocaine (LIDODERM) 5 % Place 2 patches onto the skin daily. Apply 1 patch to each side of your neck for a total of 2 patches per day. Remove & Discard patch within 12 hours. 07/28/19   Petrucelli, Samantha R, PA-C  methocarbamol (ROBAXIN) 500 MG tablet Take 1 tablet (500 mg total) by mouth every 8 (eight) hours as needed  for muscle spasms. 07/28/19   Petrucelli, Samantha R, PA-C  naproxen (NAPROSYN) 500 MG tablet Take 1 tablet (500 mg total) by mouth 2 (two) times daily. 07/28/19   Petrucelli, Pleas Koch, PA-C    Allergies    Patient has no known allergies.  Review of Systems   Review of Systems  All other systems reviewed and are negative.  Physical Exam Updated Vital Signs BP (!) 142/89 (BP Location: Right Arm)   Pulse 71   Temp 98.1 F (36.7 C) (Oral)   Resp 18   Ht 1.689 m (5' 6.5")   Wt 64.4 kg   SpO2 100%   BMI 22.58 kg/m   Physical Exam Vitals and nursing note reviewed.  Constitutional:      Appearance: Normal appearance.  HENT:     Right Ear: External ear normal.     Left Ear: External ear normal.     Nose: Nose normal.  Eyes:     Pupils: Pupils are equal, round, and reactive to light.  Cardiovascular:     Rate and Rhythm: Normal rate.  Abdominal:     General: Abdomen is flat.     Palpations: Abdomen is soft.  Musculoskeletal:  General: Normal range of motion.     Cervical back: Normal range of motion.     Comments: Low back visualized no external signs of trauma No tenderness palpation over lumbar spine, sacral spine, or buttocks. There is no tenderness to palpation of the right hip. Hip range through extension and flexion and there is some tenderness with external rotation. No other external signs of trauma are noted on musculoskeletal exam.  Skin:    General: Skin is warm and dry.     Capillary Refill: Capillary refill takes less than 2 seconds.  Neurological:     General: No focal deficit present.     Mental Status: He is alert.     Comments: Lower extremity strength is equal 5 out of 5 hip flexor, hip extensor, knee flexor extensor, ankle and great toe flexor extensor Sensation is intact including upper medial leg Pulses are intact    ED Results / Procedures / Treatments   Labs (all labs ordered are listed, but only abnormal results are displayed) Labs  Reviewed - No data to display  EKG None  Radiology No results found.  Procedures Procedures   Medications Ordered in ED Medications - No data to display  ED Course  I have reviewed the triage vital signs and the nursing notes.  Pertinent labs & imaging results that were available during my care of the patient were reviewed by me and considered in my medical decision making (see chart for details).    MDM Rules/Calculators/A&P                          Patient with right low back pain to hip pain.  No direct trauma by history.  Patient is not receiving relief with hydrocodone.  Plan prednisone and muscle relaxants.  Patient is given a note for work and referred to orthopedics for follow-up. Final Clinical Impression(s) / ED Diagnoses Final diagnoses:  Sciatica, right side  Right hip pain    Rx / DC Orders ED Discharge Orders     None        Margarita Grizzle, MD 10/14/20 1352

## 2020-10-14 NOTE — ED Triage Notes (Signed)
Patient reports pain in his right butt cheek after picking up a trailer and reports decreased mobility. Patient reports he was seen at [facility] and prescribed Vicodin which has reportedly not helped.

## 2020-10-22 ENCOUNTER — Other Ambulatory Visit: Payer: Self-pay

## 2020-10-22 ENCOUNTER — Emergency Department (HOSPITAL_BASED_OUTPATIENT_CLINIC_OR_DEPARTMENT_OTHER)
Admission: EM | Admit: 2020-10-22 | Discharge: 2020-10-22 | Disposition: A | Discharge status: Home / Self Care | Payer: Managed Care, Other (non HMO) | Attending: Emergency Medicine | Admitting: Emergency Medicine

## 2020-10-22 ENCOUNTER — Encounter (HOSPITAL_BASED_OUTPATIENT_CLINIC_OR_DEPARTMENT_OTHER): Payer: Self-pay | Admitting: Obstetrics and Gynecology

## 2020-10-22 ENCOUNTER — Emergency Department (HOSPITAL_BASED_OUTPATIENT_CLINIC_OR_DEPARTMENT_OTHER): Payer: Managed Care, Other (non HMO)

## 2020-10-22 DIAGNOSIS — X501XXA Overexertion from prolonged static or awkward postures, initial encounter: Secondary | ICD-10-CM | POA: Diagnosis not present

## 2020-10-22 DIAGNOSIS — M79651 Pain in right thigh: Secondary | ICD-10-CM | POA: Diagnosis not present

## 2020-10-22 DIAGNOSIS — M545 Low back pain, unspecified: Secondary | ICD-10-CM | POA: Insufficient documentation

## 2020-10-22 DIAGNOSIS — M533 Sacrococcygeal disorders, not elsewhere classified: Secondary | ICD-10-CM | POA: Diagnosis not present

## 2020-10-22 DIAGNOSIS — F17210 Nicotine dependence, cigarettes, uncomplicated: Secondary | ICD-10-CM | POA: Insufficient documentation

## 2020-10-22 DIAGNOSIS — M5126 Other intervertebral disc displacement, lumbar region: Secondary | ICD-10-CM

## 2020-10-22 MED ORDER — HYDROMORPHONE HCL 1 MG/ML IJ SOLN
1.0000 mg | Freq: Once | INTRAMUSCULAR | Status: AC
  Administered 2020-10-22: 1 mg via INTRAMUSCULAR
  Filled 2020-10-22: qty 1

## 2020-10-22 MED ORDER — HYDROCODONE-ACETAMINOPHEN 7.5-325 MG PO TABS: 1.0000 | ORAL_TABLET | Freq: Four times a day (QID) | ORAL | 0 refills | Status: AC | PRN

## 2020-10-22 NOTE — ED Provider Notes (Signed)
[facility] EMERGENCY DEPT Provider Note   CSN: [id number] Arrival date & time: 10/22/20  1651     History Chief Complaint  Patient presents with   Back Pain    Douglas Mcgrath is a 60 y.o. male.  HPI  60 year old male presents to the emergency department concern for right buttocks pain that radiates into the right hip.  About a week ago the patient suffered an injury in a twisting motion after picking up something.  He had immediate right lower back/buttocks pain.  Was initially evaluated with x-rays that were negative, he has been treated with pain medicine, muscle relaxers and steroids without significant relief.  He presents tonight because he had some midline lower back pain but otherwise primary concern is uncontrolled right buttocks pain that radiates into the right lateral hip/thigh.  No numbness or tingling of the lower extremity.  Movement makes the pain worse.  No abdominal/back or flank pain.  Past Medical History:  Diagnosis Date   Tobacco use     There are no problems to display for this patient.   Past Surgical History:  Procedure Laterality Date   COLONOSCOPY     never   HERNIA REPAIR     right inguinal       Family History  Problem Relation Age of Onset   Diabetes Mother    Stroke Father    Cancer Neg Hx     Social History   Tobacco Use   Smoking status: Every Day    Packs/day: 1.50    Pack years: 0.00    Types: Cigarettes  Substance Use Topics   Alcohol use: Yes    Alcohol/week: 2.0 standard drinks    Types: 1 Cans of beer, 1 Shots of liquor per week   Drug use: No    Home Medications Prior to Admission medications   Medication Sig Start Date End Date Taking? Authorizing Provider  cyclobenzaprine (FLEXERIL) 10 MG tablet Take 1 tablet (10 mg total) by mouth 2 (two) times daily as needed for muscle spasms. 10/14/20   Margarita Grizzle, MD  lidocaine (LIDODERM) 5 % Place 2 patches onto the skin daily. Apply 1 patch to each side  of your neck for a total of 2 patches per day. Remove & Discard patch within 12 hours. 07/28/19   Petrucelli, Samantha R, PA-C  methocarbamol (ROBAXIN) 500 MG tablet Take 1 tablet (500 mg total) by mouth every 8 (eight) hours as needed for muscle spasms. 07/28/19   Petrucelli, Samantha R, PA-C  naproxen (NAPROSYN) 500 MG tablet Take 1 tablet (500 mg total) by mouth 2 (two) times daily. 07/28/19   Petrucelli, Samantha R, PA-C  predniSONE (DELTASONE) 10 MG tablet Take 2 tablets (20 mg total) by mouth 2 (two) times daily. 10/14/20   Margarita Grizzle, MD    Allergies    Patient has no known allergies.  Review of Systems   Review of Systems  Constitutional:  Negative for chills and fever.  HENT:  Negative for congestion.   Respiratory:  Negative for shortness of breath.   Cardiovascular:  Negative for chest pain.  Gastrointestinal:  Negative for abdominal pain, diarrhea and vomiting.  Genitourinary:  Negative for difficulty urinating and flank pain.  Musculoskeletal:  Positive for back pain. Negative for neck pain.       + Right buttock/hip and thigh pain  Skin:  Negative for rash.  Neurological:  Negative for weakness, numbness and headaches.   Physical Exam Updated Vital Signs BP 127/90 (  BP Location: Right Arm)   Pulse 97   Temp 98.4 F (36.9 C) (Oral)   Resp 14   SpO2 98%   Physical Exam Vitals and nursing note reviewed.  Constitutional:      Appearance: Normal appearance.  HENT:     Head: Normocephalic.     Mouth/Throat:     Mouth: Mucous membranes are moist.  Cardiovascular:     Rate and Rhythm: Normal rate.  Pulmonary:     Effort: Pulmonary effort is normal. No respiratory distress.  Abdominal:     Palpations: Abdomen is soft.     Tenderness: There is no abdominal tenderness.  Musculoskeletal:     Comments: Very mild tenderness to palpation of the midline lower back, no flank pain, some reproducible right buttock/SI pain, no overlying skin changes, lower extremities are  neurovascularly intact.  Skin:    General: Skin is warm.  Neurological:     Mental Status: He is alert and oriented to person, place, and time. Mental status is at baseline.  Psychiatric:        Mood and Affect: Mood normal.    ED Results / Procedures / Treatments   Labs (all labs ordered are listed, but only abnormal results are displayed) Labs Reviewed - No data to display  EKG None  Radiology No results found.  Procedures Procedures   Medications Ordered in ED Medications  HYDROmorphone (DILAUDID) injection 1 mg (has no administration in time range)    ED Course  I have reviewed the triage vital signs and the nursing notes.  Pertinent labs & imaging results that were available during my care of the patient were reviewed by me and considered in my medical decision making (see chart for details).    MDM Rules/Calculators/A&P                          61 year old male presents emergency department ongoing lower back pain, right buttocks pain radiating down the right thigh.  He was seen initially after twisting injury with negative x-rays.  Had been treated with Norco 5 mg, stopped taking it because he did not have any relief.  He was seen again and sent home with muscle relaxer and steroids, he has been having minimal relief with that.  He presents today requesting CT scan for evaluation of disc herniation.  He denies any numbness or tingling of the lower extremities, he is neuro intact, ambulating.  CT shows 2 disc herniations with some foraminal stenosis.  Again he is neurologically intact, no indication for emergent MRI.  I discussed how the patient was taking his Norco medication.  The wife states that he was taking it inconsistently over the past weeks, sometimes once a day.  He has otherwise not had great relief with the steroid/muscle relaxer/Lidoderm patches.  I have advised that he takes this pain medicine more consistently.  He has a couple pills left of the Norco 5 mg  that he showed me, have instructed him to take 2 of the 5 mg pills (10 mg total) tonight before bed and then I sent a new prescription of the 7.5 mg tabs and instructed him how to properly take them.  He is going to follow-up with orthopedic/spine for further evaluation.  Patient will be discharged and treated as an outpatient.  Discharge plan and strict return to ED precautions discussed, patient verbalizes understanding and agreement.  Final Clinical Impression(s) / ED Diagnoses Final diagnoses:  None  Rx / DC Orders ED Discharge Orders     None        Rozelle Logan, DO 10/22/20 2204

## 2020-10-22 NOTE — Discharge Instructions (Addendum)
You have been seen and discharged from the emergency department.  Your CAT scan shows 2 disc herniations.  You need to follow-up with a spine specialist, and orthopedic doctors been listed in your discharge papers.  You may take 2 of your hydrocodone 5 mg - 325 mg pills tonight for sleeping.  Then transition to the new prescription that I sent.  Do not mix this medication with alcohol or other sedating medications. Do not drive or do heavy physical activity and to know how this medication affects you.  It may cause drowsiness.  Follow-up with your primary provider for reevaluation and further care. Take home medications as prescribed. If you have any worsening symptoms, numbness or tingling of the lower extremities, difficulty with urinating/bowel movements, difficulty ambulating or further concerns for your health please return to an emergency department for further evaluation.

## 2020-10-22 NOTE — ED Triage Notes (Signed)
Patient reports to the ER for back pain. Patient states he needs a scan to see his back. Patient reports he is still having mobility issues and lower back pain from previous visit and it is getting worse

## 2022-08-11 DIAGNOSIS — Z1211 Encounter for screening for malignant neoplasm of colon: Secondary | ICD-10-CM | POA: Diagnosis not present

## 2022-08-11 DIAGNOSIS — R634 Abnormal weight loss: Secondary | ICD-10-CM | POA: Diagnosis not present

## 2022-12-02 DIAGNOSIS — D122 Benign neoplasm of ascending colon: Secondary | ICD-10-CM | POA: Diagnosis not present

## 2022-12-02 DIAGNOSIS — K635 Polyp of colon: Secondary | ICD-10-CM | POA: Diagnosis not present

## 2022-12-02 DIAGNOSIS — K5730 Diverticulosis of large intestine without perforation or abscess without bleeding: Secondary | ICD-10-CM | POA: Diagnosis not present

## 2022-12-02 DIAGNOSIS — D123 Benign neoplasm of transverse colon: Secondary | ICD-10-CM | POA: Diagnosis not present

## 2022-12-02 DIAGNOSIS — Z1211 Encounter for screening for malignant neoplasm of colon: Secondary | ICD-10-CM | POA: Diagnosis not present

## 2023-04-18 IMAGING — CT CT L SPINE W/O CM
3 of 4 series · 9 of 33 positions shown, 11 images · non-contrast
Comparison: None.

CLINICAL DATA: Low back pain, trauma.

EXAM:
CT LUMBAR SPINE WITHOUT CONTRAST
TECHNIQUE: Multidetector CT imaging of the lumbar spine was performed without
intravenous contrast administration. Multiplanar CT image
reconstructions were also generated.

[Series 6: coronal bone · coronal · 0.30mm/px · 3 of 64 slices shown]
[im 13/64  bone]
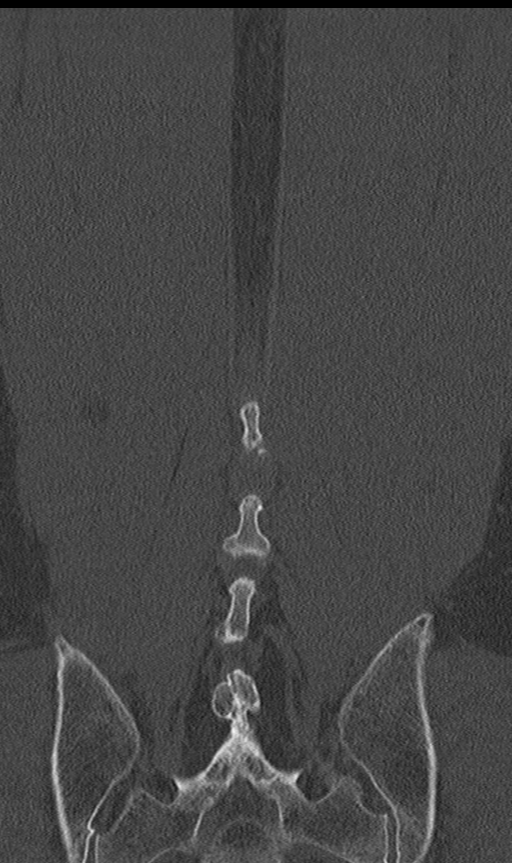
[im 26/64  bone]
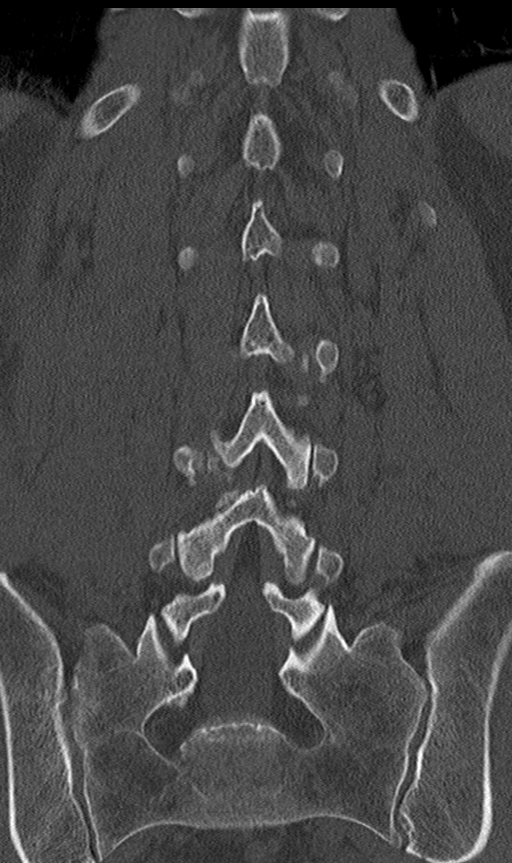
[im 38/64  bone]
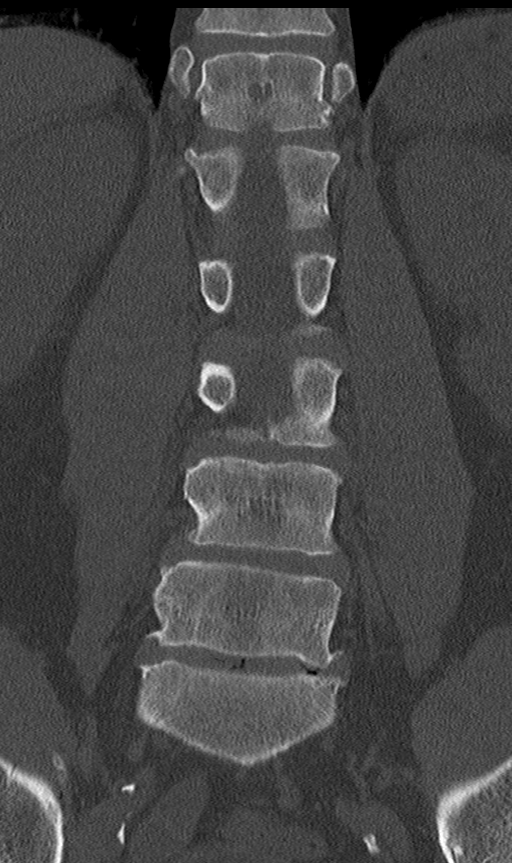

[Series 8: sagittal soft tissue · sagittal · 0.25mm/px · 5 of 79 slices shown, 6 images]
[im 27/79  bone]
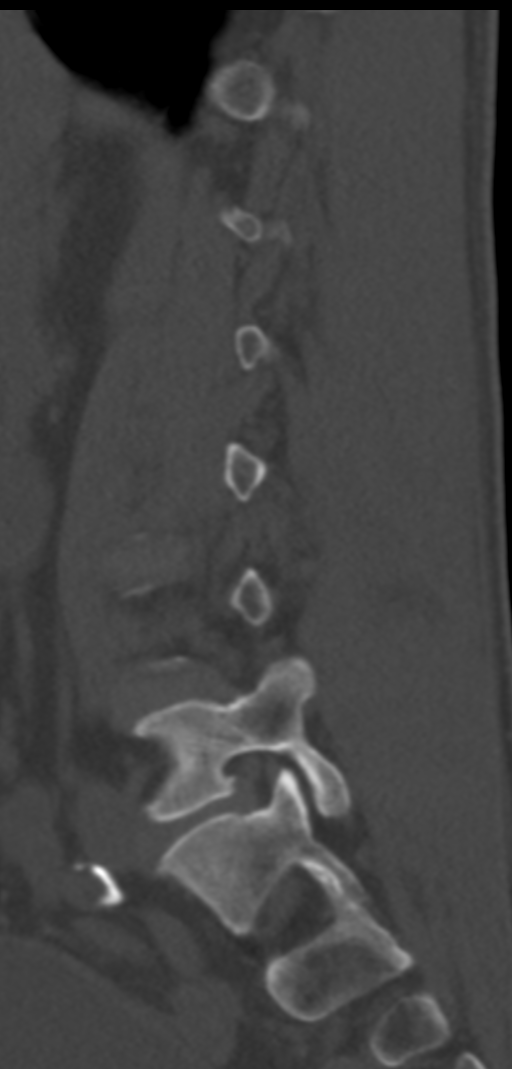
[im 33/79  bone]
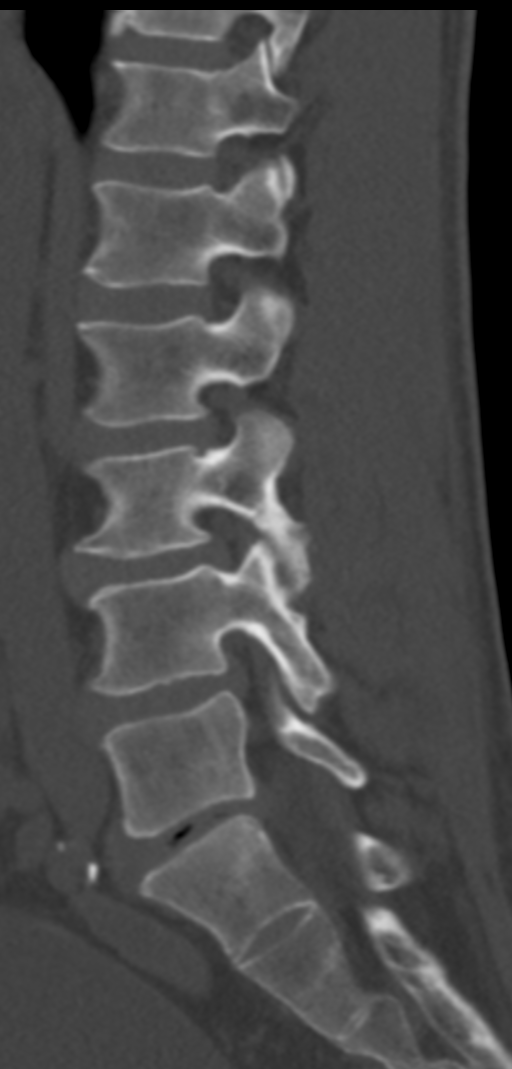
[im 40/79  soft-tissue]
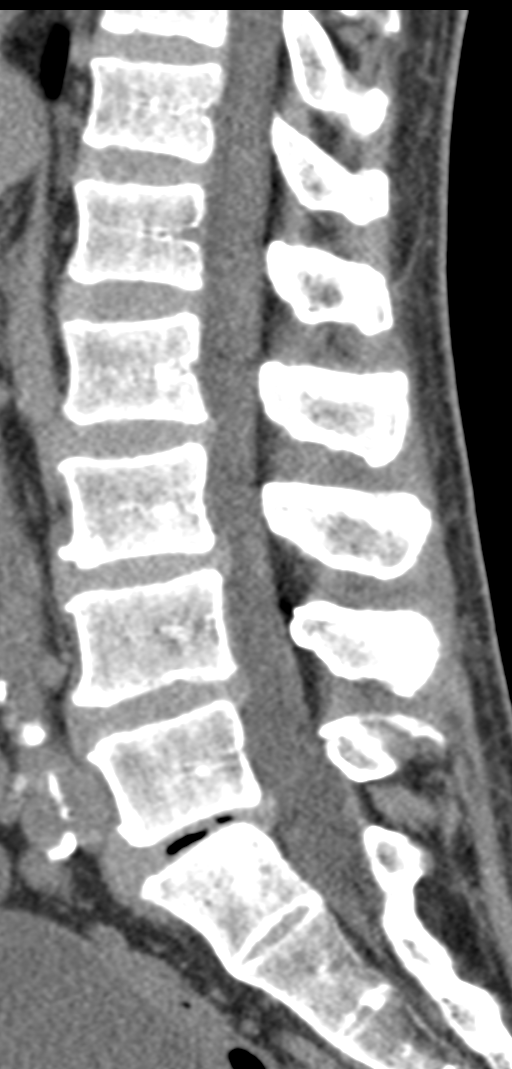
[im 40/79  bone]
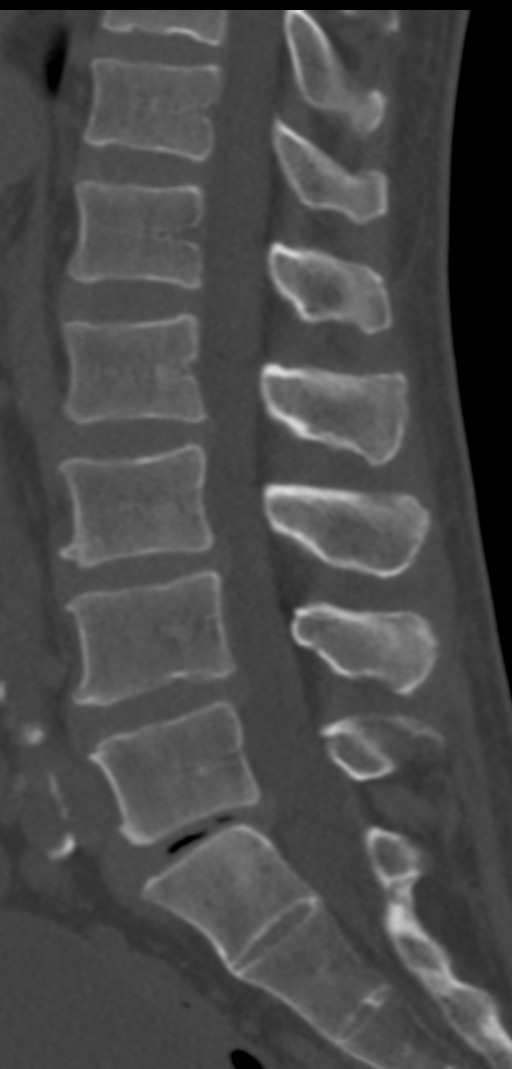
[im 46/79  bone]
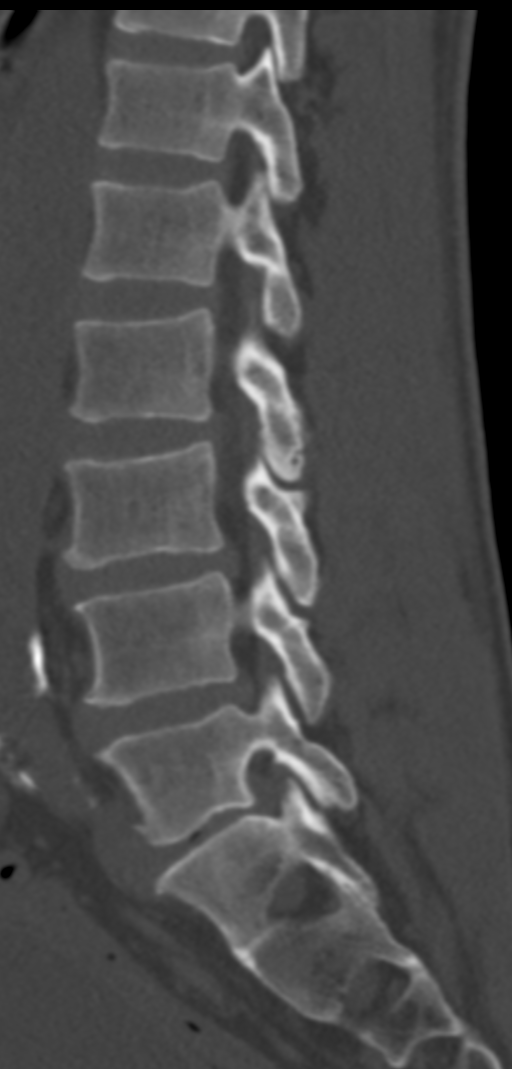
[im 53/79  bone]
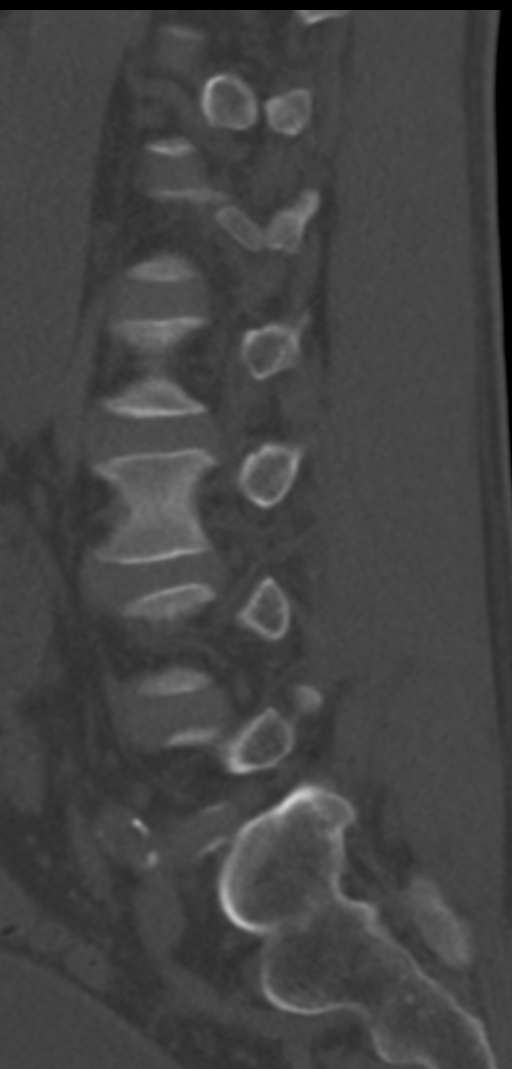

[Series 9: ax disc · axial · 0.21mm/px · z∈[-622,-622]mm · 1 of 121 slices shown, 2 images]
[im 61/121  soft-tissue]
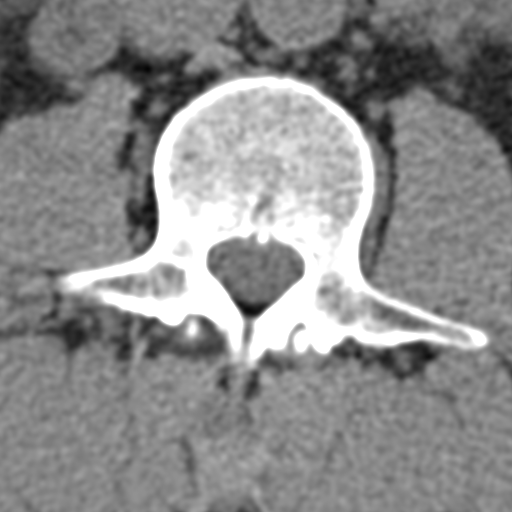
[im 61/121  bone]
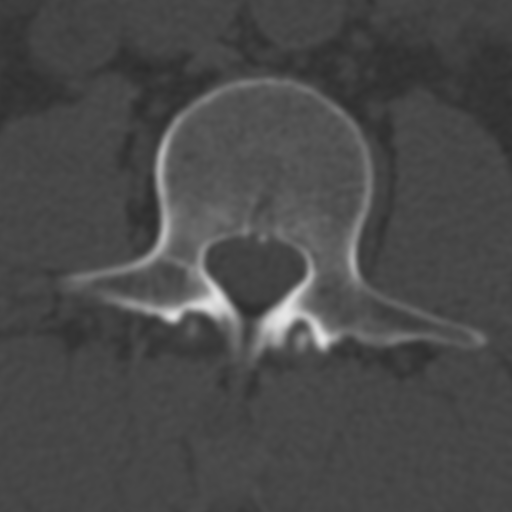

[9 of 33 positions shown; findings below may reference images not displayed]

FINDINGS: Segmentation: Rudimentary ribs at L1.

Alignment: 5 mm retrolisthesis of L5 on S1. Trace retrolisthesis of
L4 on L5 facets are normally aligned.

Vertebrae: No acute fracture or focal pathologic process.

Paraspinal and other soft tissues: Negative. Aorto bi-iliac
atherosclerosis. No aortic aneurysm where visualized.

Disc levels: T12-L1 and L1-L2 disc spaces are normal.

L2-L3: Minor broad-based disc bulge.

L3-L4: Mild disc space narrowing and broad-based disc bulge. Mild
ligamentum flavum hypertrophy. Mild narrowing of the spinal canal
and bilateral neural foramina.

L4-L5: Right a centric disc bulge. Bilateral ligamentum flavum
hypertrophy. Minimal spinal canal narrowing. Right neural foraminal
stenosis at least moderate. Mild left neural foraminal stenosis.

L5-S1: Disc space narrowing with vacuum phenomenon. Broad-based
posterior disc bulge. No significant canal stenosis. Left greater
than right neural foraminal stenosis.
IMPRESSION: 1. No acute fracture of the lumbar spine.
2. Multilevel degenerative disc disease and facet arthropathy. Disc
bulging no L3-L4 through L5-S1 with varying degrees of ligamentum
flavum hypertrophy, spinal canal and neural foraminal stenosis.
Consider MRI for more detailed assessment based on clinical concern.

Aortic Atherosclerosis ([id number]-007.7).

## 2023-08-10 DIAGNOSIS — Z1211 Encounter for screening for malignant neoplasm of colon: Secondary | ICD-10-CM | POA: Diagnosis not present

## 2023-09-20 DIAGNOSIS — Z1211 Encounter for screening for malignant neoplasm of colon: Secondary | ICD-10-CM | POA: Diagnosis not present

## 2023-09-20 DIAGNOSIS — K635 Polyp of colon: Secondary | ICD-10-CM | POA: Diagnosis not present

## 2023-09-20 DIAGNOSIS — K5730 Diverticulosis of large intestine without perforation or abscess without bleeding: Secondary | ICD-10-CM | POA: Diagnosis not present
